# Patient Record
Sex: Male | Born: 1965 | Race: White | Hispanic: No | Marital: Married | Smoking: Current every day smoker
Health system: Southern US, Community
[De-identification: ages and names within clinical notes are randomized; demographics above are authoritative.]

---

## 2006-04-24 ENCOUNTER — Emergency Department: Payer: Self-pay | Admitting: Emergency Medicine

## 2007-04-13 ENCOUNTER — Emergency Department: Payer: Self-pay | Admitting: Internal Medicine

## 2007-05-14 ENCOUNTER — Emergency Department: Payer: Self-pay | Admitting: Emergency Medicine

## 2010-02-16 ENCOUNTER — Emergency Department: Payer: Self-pay | Admitting: Emergency Medicine

## 2010-12-29 ENCOUNTER — Emergency Department: Payer: Self-pay | Admitting: Emergency Medicine

## 2011-03-11 ENCOUNTER — Emergency Department: Payer: Self-pay | Admitting: Emergency Medicine

## 2011-03-14 ENCOUNTER — Emergency Department: Payer: Self-pay | Admitting: Emergency Medicine

## 2013-01-21 ENCOUNTER — Emergency Department: Payer: Self-pay | Admitting: Emergency Medicine

## 2013-09-03 ENCOUNTER — Emergency Department: Payer: Self-pay | Admitting: Emergency Medicine

## 2013-09-03 LAB — BASIC METABOLIC PANEL
Anion Gap: 7 (ref 7–16)
BUN: 14 mg/dL (ref 7–18)
CALCIUM: 9.1 mg/dL (ref 8.5–10.1)
CHLORIDE: 106 mmol/L (ref 98–107)
Co2: 24 mmol/L (ref 21–32)
Creatinine: 1.06 mg/dL (ref 0.60–1.30)
EGFR (African American): >60
EGFR (Non-African Amer.): >60
Glucose: 84 mg/dL (ref 65–99)
Osmolality: 273 (ref 275–301)
Potassium: 3.6 mmol/L (ref 3.5–5.1)
Sodium: 137 mmol/L (ref 136–145)

## 2013-09-03 LAB — CBC
HCT: 39.2 % — ABNORMAL LOW (ref 40.0–52.0)
HGB: 13.6 g/dL (ref 13.0–18.0)
MCH: 33.1 pg (ref 26.0–34.0)
MCHC: 34.7 g/dL (ref 32.0–36.0)
MCV: 96 fL (ref 80–100)
Platelet: 311 10*3/uL (ref 150–440)
RBC: 4.1 10*6/uL — AB (ref 4.40–5.90)
RDW: 12.3 % (ref 11.5–14.5)
WBC: 10.6 10*3/uL (ref 3.8–10.6)

## 2013-12-16 ENCOUNTER — Emergency Department: Payer: Self-pay | Admitting: Emergency Medicine

## 2014-01-06 ENCOUNTER — Emergency Department: Payer: Self-pay | Admitting: Emergency Medicine

## 2015-04-18 ENCOUNTER — Emergency Department
Admission: EM | Admit: 2015-04-18 | Discharge: 2015-04-18 | Disposition: A | Discharge status: Home / Self Care | Payer: Self-pay | Attending: Emergency Medicine | Admitting: Emergency Medicine

## 2015-04-18 ENCOUNTER — Encounter: Payer: Self-pay | Admitting: Emergency Medicine

## 2015-04-18 DIAGNOSIS — M5442 Lumbago with sciatica, left side: Secondary | ICD-10-CM

## 2015-04-18 DIAGNOSIS — Y998 Other external cause status: Secondary | ICD-10-CM | POA: Insufficient documentation

## 2015-04-18 DIAGNOSIS — Y9389 Activity, other specified: Secondary | ICD-10-CM | POA: Insufficient documentation

## 2015-04-18 DIAGNOSIS — X58XXXA Exposure to other specified factors, initial encounter: Secondary | ICD-10-CM | POA: Insufficient documentation

## 2015-04-18 DIAGNOSIS — Z720 Tobacco use: Secondary | ICD-10-CM | POA: Insufficient documentation

## 2015-04-18 DIAGNOSIS — Y9289 Other specified places as the place of occurrence of the external cause: Secondary | ICD-10-CM | POA: Insufficient documentation

## 2015-04-18 DIAGNOSIS — S39012A Strain of muscle, fascia and tendon of lower back, initial encounter: Secondary | ICD-10-CM

## 2015-04-18 MED ORDER — TRAMADOL HCL 50 MG PO TABS: 50.0000 mg | ORAL_TABLET | Freq: Four times a day (QID) | ORAL | Status: AC | PRN

## 2015-04-18 MED ORDER — PREDNISONE 20 MG PO TABS: 40.0000 mg | ORAL_TABLET | Freq: Every day | ORAL | Status: DC

## 2015-04-18 NOTE — ED Provider Notes (Signed)
[facility] Emergency Department Provider Note  Time seen: 1:48 PM  I have reviewed the triage vital signs and the nursing notes.   HISTORY  Chief Complaint Back Pain    HPI Brandon Mejia is a 49 y.o. male with no past medical history presents the emergency department with lower back pain. According to the patient he was lifting heavy boxes one week ago, since this past Friday he does significant pain to lower back. He states he has been largely embedded over the weekend due to the back pain. He has not improved so he came to the emergency department for evaluation. Denies any incontinence, denies any focal weakness or numbness. Describes his back pain is moderate, worse with ambulation or twisting or bending.    History reviewed. No pertinent past medical history.  There are no active problems to display for this patient.   History reviewed. No pertinent past surgical history.  No current outpatient prescriptions on file.  Allergies Review of patient's allergies indicates no known allergies.  History reviewed. No pertinent family history.  Social History Social History  Substance Use Topics  . Smoking status: Current Some Day Smoker  . Smokeless tobacco: None  . Alcohol Use: None    Review of Systems Constitutional: Negative for fever. Cardiovascular: Negative for chest pain. Respiratory: Negative for shortness of breath. Gastrointestinal: Negative for abdominal pain Musculoskeletal: Positive for lower back pain. Neurological: Negative for headaches, focal weakness or numbness. 10-point ROS otherwise negative.  ____________________________________________   PHYSICAL EXAM:  VITAL SIGNS: ED Triage Vitals  Enc Vitals Group     BP 04/18/15 1320 153/73 mmHg     Pulse Rate 04/18/15 1320 106     Resp 04/18/15 1320 18     Temp 04/18/15 1320 98.4 F (36.9 C)     Temp Source 04/18/15 1320 Oral     SpO2 04/18/15 1320 99 %     Weight  04/18/15 1320 145 lb (65.772 kg)     Height 04/18/15 1320  (1.753 m)     Head Cir --      Peak Flow --      Pain Score 04/18/15 1344 8     Pain Loc --      Pain Edu? --      Excl. in GC? --     Constitutional: Alert and oriented. Well appearing and in no distress. Eyes: Normal exam ENT   Head: Normocephalic and atraumatic. Cardiovascular: Normal rate, regular rhythm.  Respiratory: Normal respiratory effort without tachypnea nor retractions. Breath sounds are clear and equal bilaterally. No wheezes/rales/rhonchi. Gastrointestinal: Soft and nontender. No distention.   Musculoskeletal: Moderate lumbar tenderness palpation, more so over the left paraspinal muscles. Neurologic:  Normal speech and language. No gross focal neurologic deficits  Skin:  Skin is warm, dry and intact.  Psychiatric: Mood and affect are normal. Speech and behavior are normal.  ____________________________________________     INITIAL IMPRESSION / ASSESSMENT AND PLAN / ED COURSE  Pertinent labs & imaging results that were available during my care of the patient were reviewed by me and considered in my medical decision making (see chart for details).  Patient with lower back pain for the past 3-4 days. He states occasional radiation down the left leg. Denies incontinence, denies focal weakness or numbness. Neurologically appears intact on exam. Patient states a history of back problems for many years with occasional flares of pain typical to today's presentation. Given the radicular nature to his pain we  will place the patient on a burst dose of steroids, Ultram as needed for discomfort, and follow-up with an orthopedist. Patient is agreeable to this plan.  ____________________________________________   FINAL CLINICAL IMPRESSION(S) / ED DIAGNOSES  Lumbosacral strain   Minna Antis, MD 04/18/15 1351

## 2015-04-18 NOTE — ED Notes (Signed)
Reports lifting boxes a week ago, back pain since

## 2015-04-18 NOTE — Discharge Instructions (Signed)
Back Pain, Adult °Back pain is very common. The pain often gets better over time. The cause of back pain is usually not dangerous. Most people can learn to manage their back pain on their own.  °HOME CARE  °· Stay active. Start with short walks on flat ground if you can. Try to walk farther each day. °· Do not sit, drive, or stand in one place for more than 30 minutes. Do not stay in bed. °· Do not avoid exercise or work. Activity can help your back heal faster. °· Be careful when you bend or lift an object. Bend at your knees, keep the object close to you, and do not twist. °· Sleep on a firm mattress. Lie on your side, and bend your knees. If you lie on your back, put a pillow under your knees. °· Only take medicines as told by your doctor. °· Put ice on the injured area. °¨ Put ice in a plastic bag. °¨ Place a towel between your skin and the bag. °¨ Leave the ice on for 15-20 minutes, 03-04 times a day for the first 2 to 3 days. After that, you can switch between ice and heat packs. °· Ask your doctor about back exercises or massage. °· Avoid feeling anxious or stressed. Find good ways to deal with stress, such as exercise. °GET HELP RIGHT AWAY IF:  °· Your pain does not go away with rest or medicine. °· Your pain does not go away in 1 week. °· You have new problems. °· You do not feel well. °· The pain spreads into your legs. °· You cannot control when you poop (bowel movement) or pee (urinate). °· Your arms or legs feel weak or lose feeling (numbness). °· You feel sick to your stomach (nauseous) or throw up (vomit). °· You have belly (abdominal) pain. °· You feel like you may pass out (faint). °MAKE SURE YOU:  °· Understand these instructions. °· Will watch your condition. °· Will get help right away if you are not doing well or get worse. °Document Released: 01/16/2008 Document Revised: 10/22/2011 Document Reviewed: 12/01/2013 °Exit[employer]® Patient Information ©2015 [employer]. This information is not intended  to replace advice given to you by your health care provider. Make sure you discuss any questions you have with your health care provider. ° °

## 2021-10-05 ENCOUNTER — Observation Stay (HOSPITAL_COMMUNITY)
Admission: EM | Admit: 2021-10-05 | Discharge: 2021-10-06 | Disposition: A | Discharge status: Home / Self Care | Payer: BC Managed Care – PPO | Attending: Family Medicine | Admitting: Family Medicine

## 2021-10-05 ENCOUNTER — Emergency Department (HOSPITAL_COMMUNITY): Payer: BC Managed Care – PPO

## 2021-10-05 ENCOUNTER — Other Ambulatory Visit: Payer: Self-pay

## 2021-10-05 ENCOUNTER — Encounter (HOSPITAL_COMMUNITY): Payer: Self-pay | Admitting: Emergency Medicine

## 2021-10-05 DIAGNOSIS — R918 Other nonspecific abnormal finding of lung field: Secondary | ICD-10-CM | POA: Insufficient documentation

## 2021-10-05 DIAGNOSIS — Z20822 Contact with and (suspected) exposure to covid-19: Secondary | ICD-10-CM | POA: Diagnosis not present

## 2021-10-05 DIAGNOSIS — R000 Tachycardia, unspecified: Secondary | ICD-10-CM | POA: Diagnosis not present

## 2021-10-05 DIAGNOSIS — R112 Nausea with vomiting, unspecified: Secondary | ICD-10-CM

## 2021-10-05 DIAGNOSIS — R4189 Other symptoms and signs involving cognitive functions and awareness: Secondary | ICD-10-CM | POA: Diagnosis not present

## 2021-10-05 DIAGNOSIS — Z720 Tobacco use: Secondary | ICD-10-CM

## 2021-10-05 DIAGNOSIS — R778 Other specified abnormalities of plasma proteins: Secondary | ICD-10-CM | POA: Insufficient documentation

## 2021-10-05 DIAGNOSIS — F17210 Nicotine dependence, cigarettes, uncomplicated: Secondary | ICD-10-CM | POA: Diagnosis not present

## 2021-10-05 DIAGNOSIS — T40721A Poisoning by synthetic cannabinoids, accidental (unintentional), initial encounter: Secondary | ICD-10-CM | POA: Insufficient documentation

## 2021-10-05 DIAGNOSIS — F1910 Other psychoactive substance abuse, uncomplicated: Secondary | ICD-10-CM

## 2021-10-05 DIAGNOSIS — D72829 Elevated white blood cell count, unspecified: Secondary | ICD-10-CM | POA: Diagnosis not present

## 2021-10-05 LAB — COMPREHENSIVE METABOLIC PANEL
ALT: 66 U/L — ABNORMAL HIGH (ref 0–44)
AST: 56 U/L — ABNORMAL HIGH (ref 15–41)
Albumin: 3.9 g/dL (ref 3.5–5.0)
Alkaline Phosphatase: 79 U/L (ref 38–126)
Anion gap: 8 (ref 5–15)
BUN: 17 mg/dL (ref 6–20)
CO2: 27 mmol/L (ref 22–32)
Calcium: 8.7 mg/dL — ABNORMAL LOW (ref 8.9–10.3)
Chloride: 105 mmol/L (ref 98–111)
Creatinine, Ser: 1.13 mg/dL (ref 0.61–1.24)
GFR, Estimated: >60 mL/min (ref >60)
Glucose, Bld: 178 mg/dL — ABNORMAL HIGH (ref 70–99)
Potassium: 4.7 mmol/L (ref 3.5–5.1)
Sodium: 140 mmol/L (ref 135–145)
Total Bilirubin: 0.5 mg/dL (ref 0.3–1.2)
Total Protein: 6.9 g/dL (ref 6.5–8.1)

## 2021-10-05 LAB — TROPONIN I (HIGH SENSITIVITY)
Troponin I (High Sensitivity): 18 ng/L — ABNORMAL HIGH (ref <18)
Troponin I (High Sensitivity): 30 ng/L — ABNORMAL HIGH (ref <18)
Troponin I (High Sensitivity): 36 ng/L — ABNORMAL HIGH (ref <18)

## 2021-10-05 LAB — CBC WITH DIFFERENTIAL/PLATELET
Abs Immature Granulocytes: 0.27 10*3/uL — ABNORMAL HIGH (ref 0.00–0.07)
Basophils Absolute: 0 10*3/uL (ref 0.0–0.1)
Basophils Relative: 0 %
Eosinophils Absolute: 0 10*3/uL (ref 0.0–0.5)
Eosinophils Relative: 0 %
HCT: 40.5 % (ref 39.0–52.0)
Hemoglobin: 13.8 g/dL (ref 13.0–17.0)
Immature Granulocytes: 1 %
Lymphocytes Relative: 4 %
Lymphs Abs: 0.8 10*3/uL (ref 0.7–4.0)
MCH: 33.6 pg (ref 26.0–34.0)
MCHC: 34.1 g/dL (ref 30.0–36.0)
MCV: 98.5 fL (ref 80.0–100.0)
Monocytes Absolute: 1.3 10*3/uL — ABNORMAL HIGH (ref 0.1–1.0)
Monocytes Relative: 6 %
Neutro Abs: 19.1 10*3/uL — ABNORMAL HIGH (ref 1.7–7.7)
Neutrophils Relative %: 89 %
Platelets: 285 10*3/uL (ref 150–400)
RBC: 4.11 MIL/uL — ABNORMAL LOW (ref 4.22–5.81)
RDW: 12.7 % (ref 11.5–15.5)
WBC: 21.5 10*3/uL — ABNORMAL HIGH (ref 4.0–10.5)
nRBC: 0 % (ref 0.0–0.2)

## 2021-10-05 LAB — RAPID URINE DRUG SCREEN, HOSP PERFORMED
Amphetamines: NOT DETECTED
Barbiturates: NOT DETECTED
Benzodiazepines: NOT DETECTED
Cocaine: POSITIVE — AB
Opiates: NOT DETECTED
Tetrahydrocannabinol: POSITIVE — AB

## 2021-10-05 LAB — RESP PANEL BY RT-PCR (FLU A&B, COVID) ARPGX2
Influenza A by PCR: NEGATIVE
Influenza B by PCR: NEGATIVE
SARS Coronavirus 2 by RT PCR: NEGATIVE

## 2021-10-05 LAB — HIV ANTIBODY (ROUTINE TESTING W REFLEX): HIV Screen 4th Generation wRfx: NONREACTIVE

## 2021-10-05 LAB — D-DIMER, QUANTITATIVE: D-Dimer, Quant: 0.97 ug/mL-FEU — ABNORMAL HIGH (ref 0.00–0.50)

## 2021-10-05 LAB — ACETAMINOPHEN LEVEL: Acetaminophen (Tylenol), Serum: <10 ug/mL — ABNORMAL LOW (ref 10–30)

## 2021-10-05 LAB — SALICYLATE LEVEL: Salicylate Lvl: <7 mg/dL — ABNORMAL LOW (ref 7.0–30.0)

## 2021-10-05 MED ORDER — ENOXAPARIN SODIUM 40 MG/0.4ML IJ SOSY
40.0000 mg | PREFILLED_SYRINGE | INTRAMUSCULAR | Status: DC
  Administered 2021-10-05: 40 mg via SUBCUTANEOUS
  Filled 2021-10-05: qty 0.4

## 2021-10-05 MED ORDER — ACETAMINOPHEN 650 MG RE SUPP: 650.0000 mg | Freq: Four times a day (QID) | RECTAL | Status: DC | PRN

## 2021-10-05 MED ORDER — SODIUM CHLORIDE 0.9 % IV SOLN: INTRAVENOUS | Status: DC

## 2021-10-05 MED ORDER — LACTATED RINGERS IV BOLUS
1000.0000 mL | Freq: Once | INTRAVENOUS | Status: AC
  Administered 2021-10-05: 1000 mL via INTRAVENOUS

## 2021-10-05 MED ORDER — ACETAMINOPHEN 325 MG PO TABS
650.0000 mg | ORAL_TABLET | Freq: Four times a day (QID) | ORAL | Status: DC | PRN
  Administered 2021-10-05 – 2021-10-06 (×3): 650 mg via ORAL
  Filled 2021-10-05 (×3): qty 2

## 2021-10-05 NOTE — ED Provider Notes (Incomplete)
Patient is a 56 year old male presenting today as a possible post CPR and possible drug overdose.  EMS reported that patient was found down and when fire got there he was agonal without palpable pulses.  He received several minutes of CPR and then they felt like they got a pulse back and noted that his pupils were pinpoint and gave Narcan.  Patient did have a King airway present when EMS arrived and they proceeded to put him in the truck as he had normal sinus rhythm at the time of their arrival.  Patient did not receive epinephrine and did not receive any shocks.  In route patient became more more lucid and self removed the [facility] airway.  Currently he has no complaints but reported he did not feel well this morning he had some nausea and vomiting and that is the last thing he remembers.  He adamantly denies using any drugs.  Very little medical information is available on this patient but no prior history of drug use.  Need to rule out cardiac pathology possible dysrhythmia.  EKG labs and imaging are pending.

## 2021-10-05 NOTE — H&P (Signed)
TR[facility] H&P   Patient Demographics:    Brandon Mejia, is a 56 y.o. male  MRN: [id number]   DOB - 04/16/1966  Admit Date - 10/05/2021  Outpatient Primary MD for the patient is Pcp, No  Referring MD/NP/PA: Dr Greta Doom  Patient coming from: Home  Chief Complaint  Patient presents with   Drug Overdose      HPI:    Brandon Mejia  is a 56 y.o. male, without significant past medical history besides tobacco abuse, THC use, who presents to ED by EMS after an episode of unresponsiveness, patient cannot recall events, wife at bedside assisted with the history, as well it was obtained from ED physician, patient presented to ED as possible post CPR versus post drug overdose, fire department reportedly found him down, he had agonal breathing and was pulseless, he was given CPR for 3 minutes and were able to get PulsePak with continued pinpoint pupils and agonal respiration, Narcan was given without much help, King airway was placed, patient then removed the [facility] airway, continue to have improvement of mental status through the time with EMS, by the time he came to the ED he was awake, alert and appropriate, he does not recall the events exactly, report he was doing in the chest, when he got home he was playing with PlayStation game, generalized weakness, fatigue, he does report feeling ill at work yesterday with nausea and vomiting with some chills as well, wife reports she found him around 1230 slumped on the chair where she called EMS where he was unresponsive, there was no tongue biting, urine or stool incontinence, patient urine drug screen came back positive for THC and cocaine, patient denies any history of cocaine use, but reports he does THC which she did overnight. - in ED awake, alert and oriented, white blood cell count was elevated at 21,000, CT head with no acute finding, EKG nonacute, troponins  trending up, but remains on the lower side 18> 30,.  [facility] consulted to admit.    Review of systems:    In addition to the HPI above,   A full 10 point Review of Systems was done, except as stated above, all other Review of Systems were negative.   With Past History of the following :    History reviewed. No pertinent past medical history.    History reviewed. No pertinent surgical history.    Social History:     Social History   Tobacco Use   Smoking status: Every Day    Types: Cigarettes   Smokeless tobacco: Not on file  Substance Use Topics   Alcohol use: Not Currently      Family History :   Patient denies any family history of sudden death.   Home Medications:   Prior to Admission medications   Medication Sig Start Date End Date Taking? Authorizing Provider  Aspirin-Salicylamide-Caffeine [facility] HEADACHE POWDER  PO) Take 1 tablet by mouth daily as needed.   Yes [provider]     Allergies:    No Known Allergies   Physical Exam:   Vitals  Blood pressure 97/77, pulse 79, temperature 98.6 F (37 C), temperature source Oral, resp. rate 20, SpO2 93 %.   1. General lying in bed in NAD,    2. Normal affect and insight, Not Suicidal or Homicidal, Awake Alert, Oriented X 3.  3. No F.N deficits, ALL C.Nerves Intact, Strength 5/5 all 4 extremities, Sensation intact all 4 extremities, Plantars down going.  4. Ears and Eyes appear Normal, Conjunctivae clear, PERRLA. Moist Oral Mucosa.  5. Supple Neck, No JVD, No cervical lymphadenopathy appriciated, No Carotid Bruits.  6. Symmetrical Chest wall movement, Good air movement bilaterally, CTAB.  7. RRR, No Gallops, Rubs or Murmurs, No Parasternal Heave.  8. Positive Bowel Sounds, Abdomen Soft, No tenderness, No organomegaly appriciated,No rebound -guarding or rigidity.  9.  No Cyanosis, Normal Skin Turgor, No Skin Rash or Bruise.  10. Good muscle tone,  joints appear normal , no effusions,  Normal ROM.  11. No Palpable Lymph Nodes in Neck or Axillae     Data Review:    CBC Recent Labs  Lab 10/05/21 1401  WBC 21.5*  HGB 13.8  HCT 40.5  PLT 285  MCV 98.5  MCH 33.6  MCHC 34.1  RDW 12.7  LYMPHSABS 0.8  MONOABS 1.3*  EOSABS 0.0  BASOSABS 0.0   ------------------------------------------------------------------------------------------------------------------  Chemistries  Recent Labs  Lab 10/05/21 1401  NA 140  K 4.7  CL 105  CO2 27  GLUCOSE 178*  BUN 17  CREATININE 1.13  CALCIUM 8.7*  AST 56*  ALT 66*  ALKPHOS 79  BILITOT 0.5   ------------------------------------------------------------------------------------------------------------------ CrCl cannot be calculated (Unknown ideal weight.). ------------------------------------------------------------------------------------------------------------------ No results for input(s): TSH, T4TOTAL, T3FREE, THYROIDAB in the last 72 hours.  Invalid input(s): FREET3  Coagulation profile No results for input(s): INR, PROTIME in the last 168 hours. ------------------------------------------------------------------------------------------------------------------- No results for input(s): DDIMER in the last 72 hours. -------------------------------------------------------------------------------------------------------------------  Cardiac Enzymes No results for input(s): CKMB, TROPONINI, MYOGLOBIN in the last 168 hours.  Invalid input(s): CK ------------------------------------------------------------------------------------------------------------------ No results found for: BNP   ---------------------------------------------------------------------------------------------------------------  Urinalysis No results found for: COLORURINE, APPEARANCEUR, LABSPEC, PHURINE, GLUCOSEU, HGBUR, BILIRUBINUR, KETONESUR, PROTEINUR, UROBILINOGEN, NITRITE,  LEUKOCYTESUR  ----------------------------------------------------------------------------------------------------------------   Imaging Results:    CT Head Wo Contrast  Result Date: 10/05/2021 CLINICAL DATA:  Fall, head trauma EXAM: CT HEAD WITHOUT CONTRAST TECHNIQUE: Contiguous axial images were obtained from the base of the skull through the vertex without intravenous contrast. RADIATION DOSE REDUCTION: This exam was performed according to the departmental dose-optimization program which includes automated exposure control, adjustment of the mA and/or kV according to patient size and/or use of iterative reconstruction technique. COMPARISON:  CT head 09/03/2013 FINDINGS: Brain: No evidence of acute infarction, hemorrhage, hydrocephalus, extra-axial collection or mass lesion/mass effect. Vascular: Negative for hyperdense vessel Skull: Negative Sinuses/Orbits: Mucosal edema throughout the paranasal sinuses. Air-fluid level left maxillary sinus. Other: None IMPRESSION: Normal CT head Sinus mucosal disease with air-fluid level on the left. Electronically Signed   By: Franchot Gallo M.D.   On: 10/05/2021 14:35   DG Chest Portable 1 View  Result Date: 10/05/2021 CLINICAL DATA:  Unresponsive. Probable drug overdose. Status post CPR. EXAM: PORTABLE CHEST 1 VIEW COMPARISON:  None. FINDINGS: The heart size and mediastinal contours are within normal limits. Both lungs are clear. No evidence of pneumomediastinum or  pneumothorax. No evidence of pleural effusion. The visualized skeletal structures are unremarkable. IMPRESSION: No acute findings.  No active disease. Electronically Signed   By: Marlaine Hind M.D.   On: 10/05/2021 13:56    My personal review of EKG: Rhythm sinus tachycardia, Rate  106/min, QTc 448    Assessment & Plan:    Principal Problem:   Unresponsiveness Active Problems:   Elevated troponin   Substance abuse (HCC)   Leukocytosis   Nausea and vomiting   Tobacco abuse    Episode of  unresponsiveness -Unclear if patient lost pulse, or this was an episode of loss of consciousness, but he did receive CPR for 3 minutes . -This may be multifactorial, most likely in the setting of substance abuse including cocaine/THC, as well due to recent viral illness where he has been having generalized body ache, nausea and vomiting. -CT head with no acute findings -Continue with IV fluids. -We will monitor on telemetry. -Continue to cycle troponins. -2D echo. -We will check D-dimers and if elevated will check CTA chest -Check magnesium level  Elevated troponins  - most likely related to chest compression, will continue to cycle, EKG nonacute.  Follow on 2D echo.  Nausea/vomiting -Is most likely due to underlying viral illness, keep on IV fluids and as needed nausea medications -Follow on COVID-19 test.  Substance abuse -Counseled, he denies any intentional cocaine use, he reports likely his THC was laced with cocaine.  Tobacco abuse -He was counseled.  Leukocytosis -This is stress related, he is afebrile, nontoxic-appearing   DVT Prophylaxis Lovenox   AM Labs Ordered, also please review Full Orders  Family Communication: Admission, patients condition and plan of care including tests being ordered have been discussed with the patient and wife at bedside who indicate understanding and agree with the plan and Code Status.  Code Status Full  Likely DC to  home  Condition GUARDED    Consults called: none    Admission status: observation    Time spent in minutes : 55 minutes   Phillips Climes M.D on 10/05/2021 at 5:08 PM   [facility] - Office  [phone]

## 2021-10-05 NOTE — ED Notes (Signed)
Placed pt on 2l of O2 due to SPO2 dropping down to 81

## 2021-10-05 NOTE — ED Triage Notes (Signed)
Pt arrives via EMS as post CPR possible drug overdose. Pt was found unresponsive at a firestation. The crew gave 3 rounds of CPR with return of pulses. King airway was placed. Pt was given narcan around 1200 after they noticed pinpoint pupils. Hx of substance abuse. Pt began to wake up and pull king airway out. EMS placed nonrebreather on pt. Pt arrives alert/oriented, no complaints. BP 137/78, HR 100, 96% on room air now. CBG 300

## 2021-10-05 NOTE — ED Provider Notes (Addendum)
[facility] EMERGENCY DEPARTMENT  Provider Note  CSN: [id number] Arrival date & time: 10/05/21 1319  History Chief Complaint  Patient presents with   Drug Overdose    Brandon Mejia is a 56 y.o. male who presented today as a possible post CPR versus post drug overdose.  Fire department reportedly found the patient down.  He had agonal respirations and was pulseless.  He was given CPR and they are able to get a pulse back but he continued pinpoint pupils and agonal respirations.  Narcan was given.  King airway was placed.  Patient then removed the [facility] airway or attempted to.  He continued to have improvement in his mental status throughout the time with EMS.  The airway was removed.  On arrival he was awake and alert.  He did not remember what happened today.  He adamantly denies any use of drugs or alcohol today.  He worked overnight last night, as he normally does.  He reported getting home and playing video games and then did not remember thing else for the morning.   Home Medications Prior to Admission medications   Medication Sig Start Date End Date Taking? Authorizing Provider  predniSONE (DELTASONE) 20 MG tablet Take 2 tablets (40 mg total) by mouth daily. 04/18/15   Harvest Dark, MD     Allergies    Patient has no known allergies.   Review of Systems   Review of Systems  Constitutional:  Negative for chills and fever.  HENT:  Negative for ear pain and sore throat.   Eyes:  Negative for pain and visual disturbance.  Respiratory:  Negative for cough and shortness of breath.   Cardiovascular:  Negative for chest pain and palpitations.  Gastrointestinal:  Negative for abdominal pain and vomiting.  Genitourinary:  Negative for dysuria and hematuria.  Musculoskeletal:  Negative for arthralgias and back pain.  Skin:  Negative for color change and rash.  Neurological:  Negative for seizures and syncope.  All other systems reviewed and are negative. Please  see HPI for pertinent positives and negatives  Physical Exam BP 91/65    Pulse 72    Temp 98.6 F (37 C) (Oral)    Resp (!) 7    SpO2 94%   Physical Exam Vitals and nursing note reviewed.  Constitutional:      General: He is not in acute distress.    Appearance: He is well-developed.  HENT:     Head: Normocephalic and atraumatic.  Eyes:     Conjunctiva/sclera: Conjunctivae normal.  Cardiovascular:     Rate and Rhythm: Regular rhythm. Tachycardia present.     Heart sounds: No murmur heard. Pulmonary:     Effort: Pulmonary effort is normal. No respiratory distress.     Breath sounds: Normal breath sounds.  Abdominal:     Palpations: Abdomen is soft.     Tenderness: There is no abdominal tenderness.  Musculoskeletal:        General: No swelling.     Cervical back: Neck supple.     Right lower leg: No edema.     Left lower leg: No edema.  Skin:    General: Skin is warm and dry.     Capillary Refill: Capillary refill takes less than 2 seconds.     Findings: No rash.  Neurological:     General: No focal deficit present.     Mental Status: He is alert and oriented to person, place, and time. Mental status is at  baseline.     Cranial Nerves: No cranial nerve deficit.     Sensory: No sensory deficit.     Motor: No weakness.  Psychiatric:        Mood and Affect: Mood normal.    ED Results / Procedures / Treatments   EKG EKG Interpretation  Date/Time:  Thursday October 05 2021 13:21:56 EST Ventricular Rate:  106 PR Interval:  135 QRS Duration: 83 QT Interval:  337 QTC Calculation: 448 R Axis:   81 Text Interpretation: Sinus tachycardia No previous tracing Confirmed by Blanchie Dessert [phone]) on 10/05/2021 1:35:33 PM  Procedures Procedures  Medications Ordered in the ED Medications  lactated ringers bolus 1,000 mL (0 mLs Intravenous Stopped 10/05/21 1518)     ED Course       MDM   This patient presents to the ED after requiring CPR and Narcan, this  involves an extensive number of treatment options, and is a complaint that carries with it a high risk of complications and morbidity.  The differential diagnosis includes drug overdose, seizure, dysrhythmia.   Additional history obtained: Additional history obtained from family and EMS  Records reviewed previous admission documents, Care Everywhere/External Records, and Primary Care Documents  Lab Tests: I Ordered, and personally interpreted labs.  The pertinent results include: Leukocytosis.  Salicylates and acetaminophen were normal.  Initial troponin 18. Remainder of labs are pending.  Imaging Studies ordered: I ordered imaging studies including CT scan head and X-ray last   I independently visualized and interpreted imaging which showed no acute abnormalities on either the CT or the chest x-ray. I agree with the radiologist interpretation  EKG (personally reviewed and interpreted): No acute abnormalities.  No dysrhythmia.  No STEMI.  No priors to compare to.   Medical Decision Making: Presented after possible drug overdose.  He required CPR and Narcan in the field.  However, he only denies any drug use or alcohol use today.  No previous opioid prescriptions based on PDMP review.  He was AOx4 on arrival and at baseline.  He did have leukocytosis on labs but no significant findings on exam. Initial troponin 18. Etiology of his presentation today remained unclear at the time of signout.  Plan for admission to the hospital for ongoing evaluation and treatment.  Patient is agreeable to this plan.  Complexity of problems addressed: Patients presentation is most consistent with  acute presentation with potential threat to life or bodily function  Patient seen in conjunction with my attending, Dr. Maryan Rued.    Final Clinical Impression(s) / ED Diagnoses Final diagnoses:  Unresponsive    Rx / DC Orders ED Discharge Orders     None           Jacelyn Pi, MD 10/05/21 [place], MD 10/06/21 [phone]

## 2021-10-05 NOTE — Progress Notes (Signed)
Patient arrived to 4E from ED. Vitals taken and stable. Tele placed and CCMD notified. CHG performed. Assessment completed and patient oriented to unit. Call bell within reach. Menu provided for patient to order dinner.  Brandon Mejia

## 2021-10-06 ENCOUNTER — Observation Stay (HOSPITAL_BASED_OUTPATIENT_CLINIC_OR_DEPARTMENT_OTHER): Payer: BC Managed Care – PPO

## 2021-10-06 ENCOUNTER — Observation Stay (HOSPITAL_COMMUNITY): Payer: BC Managed Care – PPO

## 2021-10-06 DIAGNOSIS — I469 Cardiac arrest, cause unspecified: Secondary | ICD-10-CM

## 2021-10-06 DIAGNOSIS — R4189 Other symptoms and signs involving cognitive functions and awareness: Secondary | ICD-10-CM

## 2021-10-06 LAB — CBC
HCT: 39.7 % (ref 39.0–52.0)
Hemoglobin: 13.2 g/dL (ref 13.0–17.0)
MCH: 33 pg (ref 26.0–34.0)
MCHC: 33.2 g/dL (ref 30.0–36.0)
MCV: 99.3 fL (ref 80.0–100.0)
Platelets: 241 10*3/uL (ref 150–400)
RBC: 4 MIL/uL — ABNORMAL LOW (ref 4.22–5.81)
RDW: 12.9 % (ref 11.5–15.5)
WBC: 14 10*3/uL — ABNORMAL HIGH (ref 4.0–10.5)
nRBC: 0 % (ref 0.0–0.2)

## 2021-10-06 LAB — COMPREHENSIVE METABOLIC PANEL
ALT: 45 U/L — ABNORMAL HIGH (ref 0–44)
AST: 50 U/L — ABNORMAL HIGH (ref 15–41)
Albumin: 3.5 g/dL (ref 3.5–5.0)
Alkaline Phosphatase: 66 U/L (ref 38–126)
Anion gap: 9 (ref 5–15)
BUN: 18 mg/dL (ref 6–20)
CO2: 23 mmol/L (ref 22–32)
Calcium: 8.5 mg/dL — ABNORMAL LOW (ref 8.9–10.3)
Chloride: 105 mmol/L (ref 98–111)
Creatinine, Ser: 0.76 mg/dL (ref 0.61–1.24)
GFR, Estimated: >60 mL/min (ref >60)
Glucose, Bld: 80 mg/dL (ref 70–99)
Potassium: 5 mmol/L (ref 3.5–5.1)
Sodium: 137 mmol/L (ref 135–145)
Total Bilirubin: 1.3 mg/dL — ABNORMAL HIGH (ref 0.3–1.2)
Total Protein: 6.1 g/dL — ABNORMAL LOW (ref 6.5–8.1)

## 2021-10-06 LAB — ECHOCARDIOGRAM COMPLETE
Area-P 1/2: 2.87 cm2
Height: 69 in
S' Lateral: 3.4 cm
Weight: 2640 oz

## 2021-10-06 LAB — MAGNESIUM: Magnesium: 2 mg/dL (ref 1.7–2.4)

## 2021-10-06 MED ORDER — IOHEXOL 350 MG/ML SOLN
80.0000 mL | Freq: Once | INTRAVENOUS | Status: AC | PRN
  Administered 2021-10-06: 80 mL via INTRAVENOUS

## 2021-10-06 NOTE — Discharge Summary (Signed)
Physician Discharge Summary  Brandon Mejia JJH:[id number] DOB: 1966-07-16 DOA: 10/05/2021  PCP: Pcp, No  Admit date: 10/05/2021 Discharge date: 10/06/2021  Time spent: 40 minutes  Recommendations for Outpatient Follow-up:  Follow-up with primary care physician in 1 to 2 weeks Stop using illegal drugs it will kill you   Discharge Diagnoses:  Principal Problem:   Unresponsiveness Active Problems:   Tobacco abuse   Substance abuse (HCC)   Leukocytosis   Elevated troponin   Nausea and vomiting   Discharge Condition: Stable   Filed Weights   10/05/21 1744  Weight: 74.8 kg    History of present illness:   Brandon Mejia  is a 56 y.o. male, without significant past medical history besides tobacco abuse, THC use, who presents to ED by EMS after an episode of unresponsiveness, patient cannot recall events, wife at bedside assisted with the history, as well it was obtained from ED physician, patient presented to ED as possible post CPR versus post drug overdose, fire department reportedly found him down, he had agonal breathing and was pulseless, he was given CPR for 3 minutes and were able to get PulsePak with continued pinpoint pupils and agonal respiration, Narcan was given without much help, King airway was placed, patient then removed the [facility] airway, continue to have improvement of mental status through the time with EMS, by the time he came to the ED he was awake, alert and appropriate, he does not recall the events exactly, report he was doing in the chest, when he got home he was playing with PlayStation game, generalized weakness, fatigue, he does report feeling ill at work yesterday with nausea and vomiting with some chills as well, wife reports she found him around 1230 slumped on the chair where she called EMS where he was unresponsive, there was no tongue biting, urine or stool incontinence, patient urine drug screen came back positive for THC and cocaine, patient denies any history of  cocaine use, but reports he does THC which she did overnight. - in ED awake, alert and oriented, white blood cell count was elevated at 21,000, CT head with no acute finding, EKG nonacute, troponins trending up, but remains on the lower side 18> 30,.  [facility] consulted to admit.  Hospital Course:  Episode of unresponsiveness -Unclear if patient lost pulse, or this was an episode of loss of consciousness, but he did receive CPR for 3 minutes . -This may be multifactorial, most likely in the setting of substance abuse including cocaine/THC, as well due to recent viral illness where he has been having generalized body ache, nausea and vomiting. -CT head with no acute findings normal -Continue with IV fluids. -We will monitor on telemetry. -Continue to cycle troponins.  All normal -2D echo normal -CTA negative for PE -Patient instructed to stop doing illicit substances -Doubt actually lost pulse and was not a true code as patient had quick response to less than 3 minutes of CPR   Elevated troponins  - most likely related to chest compression, will continue to cycle, EKG nonacute.     Nausea/vomiting -Is most likely due to underlying viral illness, keep on IV fluids and as needed nausea medications   Substance abuse -Counseled, he denies any intentional cocaine use, he reports likely his THC was laced with cocaine.   Tobacco abuse -He was counseled.   Leukocytosis -This is stress related, he is afebrile, nontoxic-appearing   Patient discharged in improved and baseline condition.  Again instructed against the dangers of  using illicit drugs.  He is to follow-up with his primary care physician approximately 1 to 2 weeks.    Discharge Exam: Vitals:   10/06/21 1211 10/06/21 1553  BP: 100/62   Pulse: 62   Resp: 16   Temp: 98.1 F (36.7 C)   SpO2: 96% 94%    General: Alert and oriented no apparent distress Cardiovascular: Regular rate and rhythm without murmurs rubs or  gallops Respiratory: Clear to auscultation bilateral no wheezes rhonchi rales  Discharge Instructions   Discharge Instructions     Diet - low sodium heart healthy   Complete by: As directed    Discharge instructions   Complete by: As directed    Stop doing illegal drugs, it will kill you  Follow-up with primary care physician in 1 week   Increase activity slowly   Complete by: As directed       Allergies as of 10/06/2021   No Known Allergies      Medication List     STOP taking these medications    BC HEADACHE POWDER PO       No Known Allergies    The results of significant diagnostics from this hospitalization (including imaging, microbiology, ancillary and laboratory) are listed below for reference.    Significant Diagnostic Studies: CT Head Wo Contrast  Result Date: 10/05/2021 CLINICAL DATA:  Fall, head trauma EXAM: CT HEAD WITHOUT CONTRAST TECHNIQUE: Contiguous axial images were obtained from the base of the skull through the vertex without intravenous contrast. RADIATION DOSE REDUCTION: This exam was performed according to the departmental dose-optimization program which includes automated exposure control, adjustment of the mA and/or kV according to patient size and/or use of iterative reconstruction technique. COMPARISON:  CT head 09/03/2013 FINDINGS: Brain: No evidence of acute infarction, hemorrhage, hydrocephalus, extra-axial collection or mass lesion/mass effect. Vascular: Negative for hyperdense vessel Skull: Negative Sinuses/Orbits: Mucosal edema throughout the paranasal sinuses. Air-fluid level left maxillary sinus. Other: None IMPRESSION: Normal CT head Sinus mucosal disease with air-fluid level on the left. Electronically Signed   By: Marlan Palau M.D.   On: 10/05/2021 14:35   CT Angio Chest Pulmonary Embolism (PE) W or WO Contrast  Result Date: 10/06/2021 CLINICAL DATA:  Simple syncope. Normal neuro exam. Chest pain. Unresponsive yesterday and had 4  rounds of CPR. Possible drug overdose. EXAM: CT ANGIOGRAPHY CHEST WITH CONTRAST TECHNIQUE: Multidetector CT imaging of the chest was performed using the standard protocol during bolus administration of intravenous contrast. Multiplanar CT image reconstructions and MIPs were obtained to evaluate the vascular anatomy. RADIATION DOSE REDUCTION: This exam was performed according to the departmental dose-optimization program which includes automated exposure control, adjustment of the mA and/or kV according to patient size and/or use of iterative reconstruction technique. CONTRAST:  59mL OMNIPAQUE IOHEXOL 350 MG/ML SOLN COMPARISON:  AP chest 10/05/2021 FINDINGS: Cardiovascular: The main pulmonary artery is opacified up to 328 Hounsfield units. No filling defect is seen to indicate a pulmonary embolism. Heart size is at the upper limits of normal. No pericardial effusion. No thoracic aortic aneurysm. No aortic dissection. Mediastinum/Nodes: No axillary mediastinal or hilar pathologically enlarged lymph nodes by CT criteria. The esophagus follows a normal course of normal caliber. Lungs/Pleura: The central airways are patent. Motion artifact mildly limits evaluation of the lung parenchyma. Mild bilateral peripheral inferior subpleural interlobular septal thickening ground-glass opacities, and curvilinear opacities, likely subsegmental atelectasis versus scarring. No pleural effusion or pneumothorax. No focal airspace consolidation to indicate pneumonia. Upper Abdomen: No acute abnormality. Musculoskeletal: Mild  multilevel degenerative disc changes of the thoracic spine Review of the MIP images confirms the above findings. IMPRESSION:: IMPRESSION: 1. No acute pulmonary embolism. 2. Mild bilateral basilar likely subsegmental atelectasis versus scarring. Electronically Signed   By: Neita Garnet M.D.   On: 10/06/2021 08:18   DG Chest Portable 1 View  Result Date: 10/05/2021 CLINICAL DATA:  Unresponsive. Probable drug  overdose. Status post CPR. EXAM: PORTABLE CHEST 1 VIEW COMPARISON:  None. FINDINGS: The heart size and mediastinal contours are within normal limits. Both lungs are clear. No evidence of pneumomediastinum or pneumothorax. No evidence of pleural effusion. The visualized skeletal structures are unremarkable. IMPRESSION: No acute findings.  No active disease. Electronically Signed   By: Danae Orleans M.D.   On: 10/05/2021 13:56   ECHOCARDIOGRAM COMPLETE  Result Date: 10/06/2021    ECHOCARDIOGRAM REPORT   Patient Name:   KRISHAWN VANDERWEELE Date of Exam: 10/06/2021 Medical Rec #:  [id number]    Height:       69.0 in Accession #:    [id number]   Weight:       165.0 lb Date of Birth:  1966/06/28    BSA:          1.904 m Patient Age:    55 years     BP:           104/77 mmHg Patient Gender: M            HR:           83 bpm. Exam Location:  Inpatient Procedure: 2D Echo Indications:    cardiac arrest  History:        Patient has no prior history of Echocardiogram examinations.                 Risk Factors:substance abuse.  Sonographer:    Delcie Roch RDCS Referring Phys: [id number] [place] IMPRESSIONS  1. Left ventricular ejection fraction, by estimation, is 60 to 65%. The left ventricle has normal function. The left ventricle has no regional wall motion abnormalities. Left ventricular diastolic parameters are consistent with Grade I diastolic dysfunction (impaired relaxation).  2. Right ventricular systolic function is normal. The right ventricular size is normal. There is normal pulmonary artery systolic pressure.  3. The mitral valve is normal in structure. Mild mitral valve regurgitation. No evidence of mitral stenosis.  4. The aortic valve is normal in structure. Aortic valve regurgitation is not visualized. No aortic stenosis is present.  5. The inferior vena cava is normal in size with greater than 50% respiratory variability, suggesting right atrial pressure of 3 mmHg. FINDINGS  Left Ventricle: Left ventricular  ejection fraction, by estimation, is 60 to 65%. The left ventricle has normal function. The left ventricle has no regional wall motion abnormalities. The left ventricular internal cavity size was normal in size. There is  no left ventricular hypertrophy. Left ventricular diastolic parameters are consistent with Grade I diastolic dysfunction (impaired relaxation). Right Ventricle: The right ventricular size is normal. No increase in right ventricular wall thickness. Right ventricular systolic function is normal. There is normal pulmonary artery systolic pressure. The tricuspid regurgitant velocity is 2.86 m/s, and  with an assumed right atrial pressure of 3 mmHg, the estimated right ventricular systolic pressure is 35.7 mmHg. Left Atrium: Left atrial size was normal in size. Right Atrium: Right atrial size was normal in size. Pericardium: There is no evidence of pericardial effusion. Mitral Valve: The mitral valve is normal in structure. Mild mitral valve  regurgitation. No evidence of mitral valve stenosis. Tricuspid Valve: The tricuspid valve is normal in structure. Tricuspid valve regurgitation is trivial. No evidence of tricuspid stenosis. Aortic Valve: The aortic valve is normal in structure. Aortic valve regurgitation is not visualized. No aortic stenosis is present. Pulmonic Valve: The pulmonic valve was normal in structure. Pulmonic valve regurgitation is trivial. No evidence of pulmonic stenosis. Aorta: The aortic root is normal in size and structure. Venous: The inferior vena cava is normal in size with greater than 50% respiratory variability, suggesting right atrial pressure of 3 mmHg. IAS/Shunts: No atrial level shunt detected by color flow Doppler.  LEFT VENTRICLE PLAX 2D LVIDd:         5.00 cm   Diastology LVIDs:         3.40 cm   LV e' medial:    12.70 cm/s LV PW:         0.90 cm   LV E/e' medial:  7.4 LV IVS:        0.80 cm   LV e' lateral:   18.10 cm/s LVOT diam:     2.20 cm   LV E/e' lateral: 5.2 LV  SV:         74 LV SV Index:   39 LVOT Area:     3.80 cm  RIGHT VENTRICLE             IVC RV S prime:     16.90 cm/s  IVC diam: 2.10 cm TAPSE (M-mode): 2.7 cm LEFT ATRIUM             Index        RIGHT ATRIUM           Index LA diam:        3.50 cm 1.84 cm/m   RA Area:     16.70 cm LA Vol (A2C):   48.1 ml 25.27 ml/m  RA Volume:   47.40 ml  24.90 ml/m LA Vol (A4C):   48.2 ml 25.32 ml/m LA Biplane Vol: 50.4 ml 26.47 ml/m  AORTIC VALVE LVOT Vmax:   107.00 cm/s LVOT Vmean:  68.900 cm/s LVOT VTI:    0.195 m  AORTA Ao Root diam: 3.20 cm Ao Asc diam:  3.40 cm MITRAL VALVE               TRICUSPID VALVE MV Area (PHT): 2.87 cm    TR Peak grad:   32.7 mmHg MV Decel Time: 264 msec    TR Vmax:        286.00 cm/s MV E velocity: 94.60 cm/s MV A velocity: 94.10 cm/s  SHUNTS MV E/A ratio:  1.01        Systemic VTI:  0.20 m                            Systemic Diam: 2.20 cm Donato SchultzMark Skains MD Electronically signed by Donato SchultzMark Skains MD Signature Date/Time: 10/06/2021/4:05:59 PM    Final     Microbiology: Recent Results (from the past 240 hour(s))  Resp Panel by RT-PCR (Flu A&B, Covid) Nasopharyngeal Swab     Status: None   Collection Time: 10/05/21  3:54 PM   Specimen: Nasopharyngeal Swab; Nasopharyngeal(NP) swabs in vial transport medium  Result Value Ref Range Status   SARS Coronavirus 2 by RT PCR NEGATIVE NEGATIVE Final    Comment: (NOTE) SARS-CoV-2 target nucleic acids are NOT DETECTED.  The SARS-CoV-2 RNA is generally detectable in  upper respiratory specimens during the acute phase of infection. The lowest concentration of SARS-CoV-2 viral copies this assay can detect is 138 copies/mL. A negative result does not preclude SARS-Cov-2 infection and should not be used as the sole basis for treatment or other patient management decisions. A negative result may occur with  improper specimen collection/handling, submission of specimen other than nasopharyngeal swab, presence of viral mutation(s) within the areas  targeted by this assay, and inadequate number of viral copies(<138 copies/mL). A negative result must be combined with clinical observations, patient history, and epidemiological information. The expected result is Negative.  Fact Sheet for Patients:  BloggerCourse.comhttps://www.fda.gov/media/152166/download  Fact Sheet for Healthcare Providers:  SeriousBroker.ithttps://www.fda.gov/media/152162/download  This test is no t yet approved or cleared by the [place] FDA and  has been authorized for detection and/or diagnosis of SARS-CoV-2 by FDA under an Emergency Use Authorization (EUA). This EUA will remain  in effect (meaning this test can be used) for the duration of the COVID-19 declaration under Section 564(b)(1) of the Act, 21 U.S.C.section 360bbb-3(b)(1), unless the authorization is terminated  or revoked sooner.       Influenza A by PCR NEGATIVE NEGATIVE Final   Influenza B by PCR NEGATIVE NEGATIVE Final    Comment: (NOTE) The Xpert Xpress SARS-CoV-2/FLU/RSV plus assay is intended as an aid in the diagnosis of influenza from Nasopharyngeal swab specimens and should not be used as a sole basis for treatment. Nasal washings and aspirates are unacceptable for Xpert Xpress SARS-CoV-2/FLU/RSV testing.  Fact Sheet for Patients: BloggerCourse.comhttps://www.fda.gov/media/152166/download  Fact Sheet for Healthcare Providers: SeriousBroker.ithttps://www.fda.gov/media/152162/download  This test is not yet approved or cleared by the [place] FDA and has been authorized for detection and/or diagnosis of SARS-CoV-2 by FDA under an Emergency Use Authorization (EUA). This EUA will remain in effect (meaning this test can be used) for the duration of the COVID-19 declaration under Section 564(b)(1) of the Act, 21 U.S.C. section 360bbb-3(b)(1), unless the authorization is terminated or revoked.  Performed at [facility], [address] [place], [place] [address]      Labs: Basic Metabolic Panel: Recent Labs  Lab  10/05/21 1401 10/05/21 1720 10/06/21 0215  NA 140  --  137  K 4.7  --  5.0  CL 105  --  105  CO2 27  --  23  GLUCOSE 178*  --  80  BUN 17  --  18  CREATININE 1.13  --  0.76  CALCIUM 8.7*  --  8.5*  MG  --  2.0  --    Liver Function Tests: Recent Labs  Lab 10/05/21 1401 10/06/21 0215  AST 56* 50*  ALT 66* 45*  ALKPHOS 79 66  BILITOT 0.5 1.3*  PROT 6.9 6.1*  ALBUMIN 3.9 3.5   No results for input(s): LIPASE, AMYLASE in the last 168 hours. No results for input(s): AMMONIA in the last 168 hours. CBC: Recent Labs  Lab 10/05/21 1401 10/06/21 0215  WBC 21.5* 14.0*  NEUTROABS 19.1*  --   HGB 13.8 13.2  HCT 40.5 39.7  MCV 98.5 99.3  PLT 285 241   Cardiac Enzymes: No results for input(s): CKTOTAL, CKMB, CKMBINDEX, TROPONINI in the last 168 hours. BNP: BNP (last 3 results) No results for input(s): BNP in the last 8760 hours.  ProBNP (last 3 results) No results for input(s): PROBNP in the last 8760 hours.  CBG: No results for input(s): GLUCAP in the last 168 hours.     Signed:  Amarion Portell A  MD.  [facility] 10/06/2021, 4:29 PM

## 2021-10-06 NOTE — Progress Notes (Signed)
Nsg Discharge Note  Admit Date:  10/05/2021 Discharge date: 10/06/2021   Edy Belt to be D/C'd Home per MD order.  AVS completed. Patient/caregiver able to verbalize understanding.  Discharge Medication: Allergies as of 10/06/2021   No Known Allergies      Medication List     STOP taking these medications    BC HEADACHE POWDER PO        Discharge Assessment: Vitals:   10/06/21 1211 10/06/21 1553  BP: 100/62   Pulse: 62   Resp: 16   Temp: 98.1 F (36.7 C)   SpO2: 96% 94%   Skin clean, dry and intact without evidence of skin break down, no evidence of skin tears noted. IV catheter discontinued intact. Site without signs and symptoms of complications - no redness or edema noted at insertion site, patient denies c/o pain - only slight tenderness at site.  Dressing with slight pressure applied.  D/c Instructions-Education: Discharge instructions given to patient/family with verbalized understanding. D/c education completed with patient/family including follow up instructions, medication list, d/c activities limitations if indicated, with other d/c instructions as indicated by MD - patient able to verbalize understanding, all questions fully answered. Patient instructed to return to ED, call 911, or call MD for any changes in condition.  Patient escorted via WC, and D/C home via private auto.  Kizzie Bane, RN 10/06/2021 3:59 PM

## 2021-10-06 NOTE — Progress Notes (Signed)
°  Echocardiogram 2D Echocardiogram has been performed.  Delcie Roch 10/06/2021, 8:47 AM

## 2023-07-25 IMAGING — CT CT HEAD W/O CM
3 series · 16 of 47 positions shown, 19 images · non-contrast
Comparison: CT head 09/03/2013

CLINICAL DATA: Fall, head trauma



[Series 3: head 5.0 h30s · axial · 0.50mm/px · z∈[-171,-26]mm · 10 of 35 slices shown, 13 images]
[im 3/35  brain]
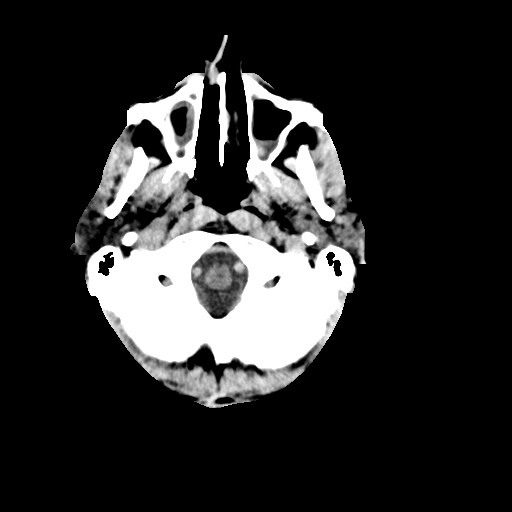
[im 3/35  bone]
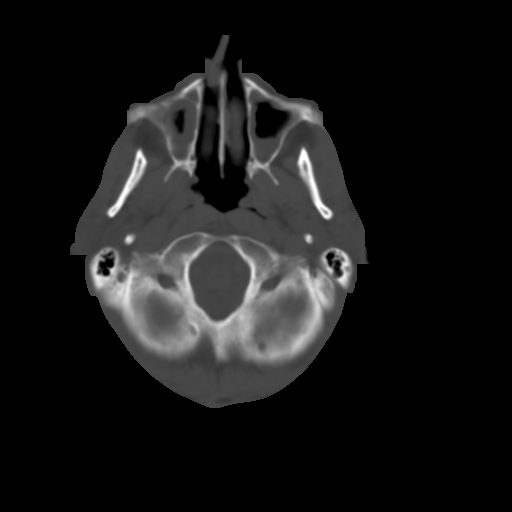
[im 6/35  brain]
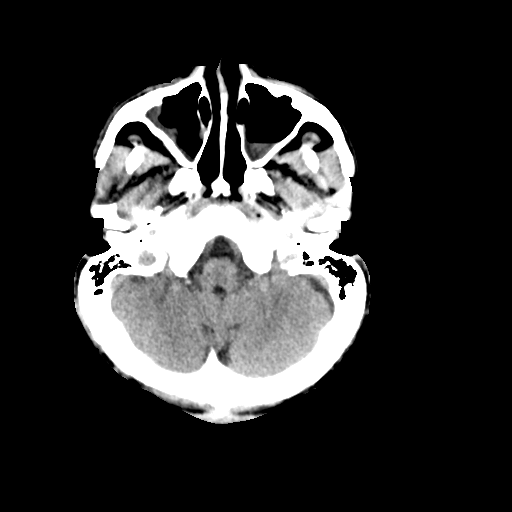
[im 10/35  brain]
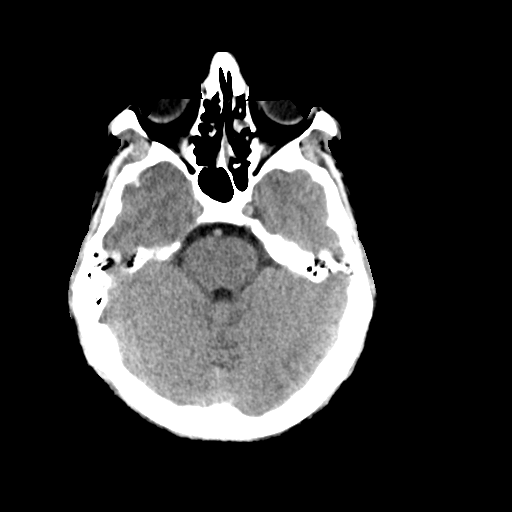
[im 12/35  brain]
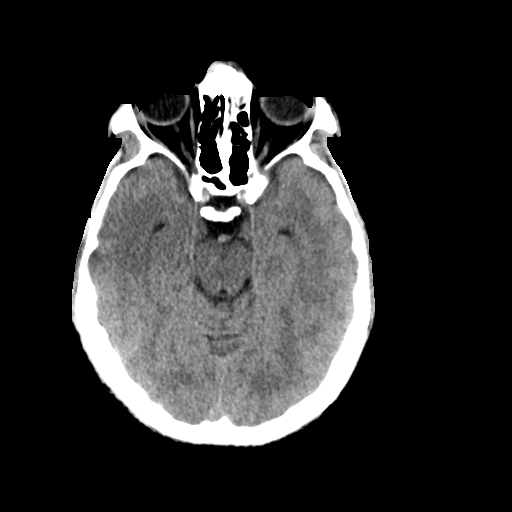
[im 16/35  brain]
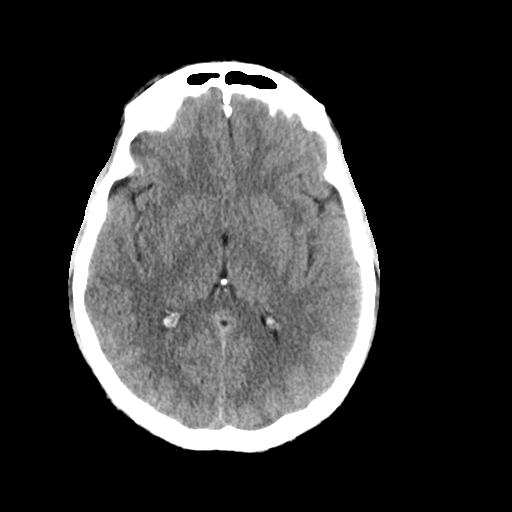
[im 16/35  bone]
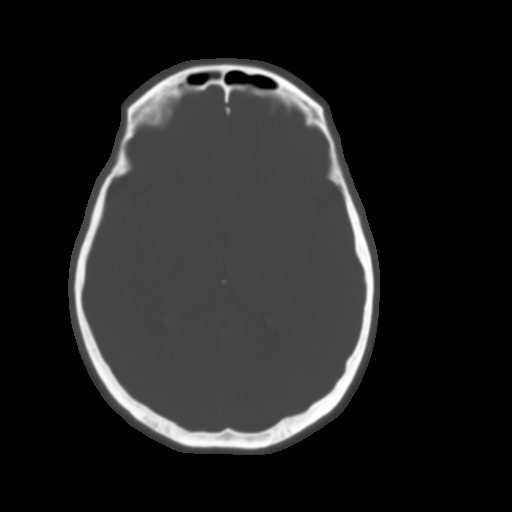
[im 19/35  brain]
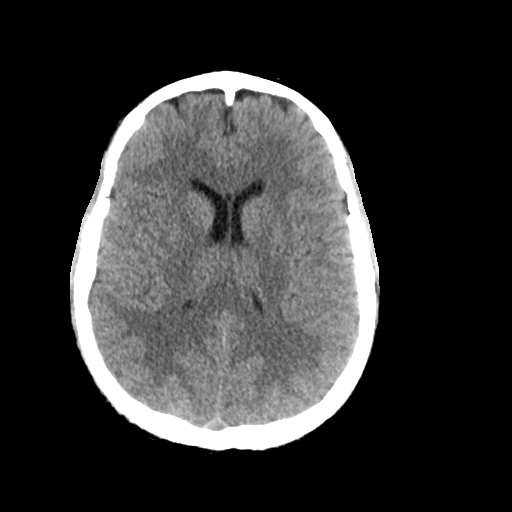
[im 23/35  brain]
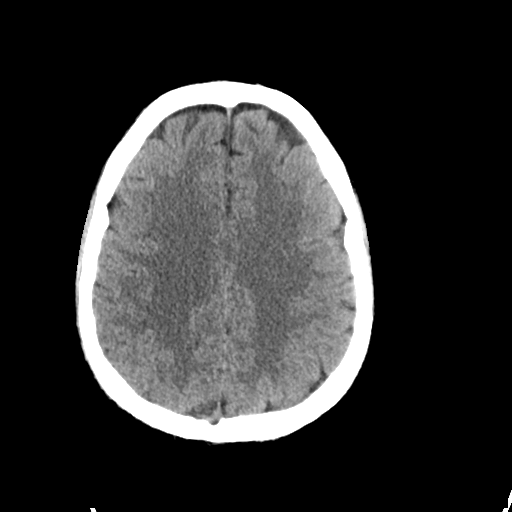
[im 26/35  brain]
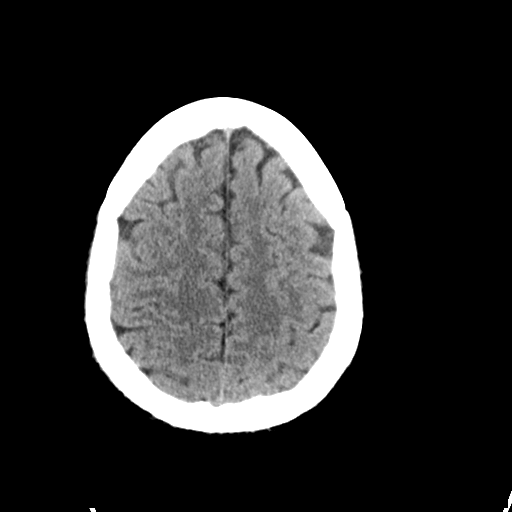
[im 29/35  brain]
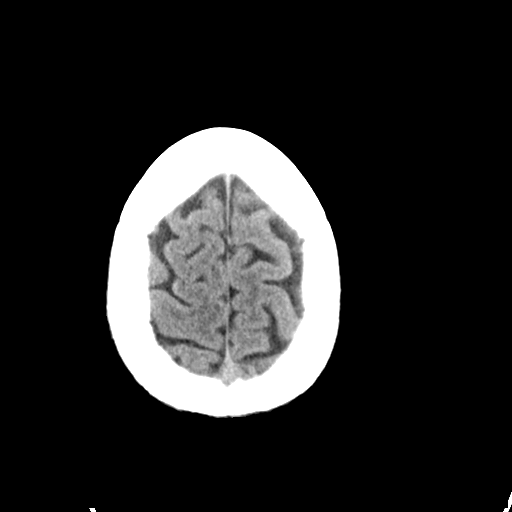
[im 29/35  bone]
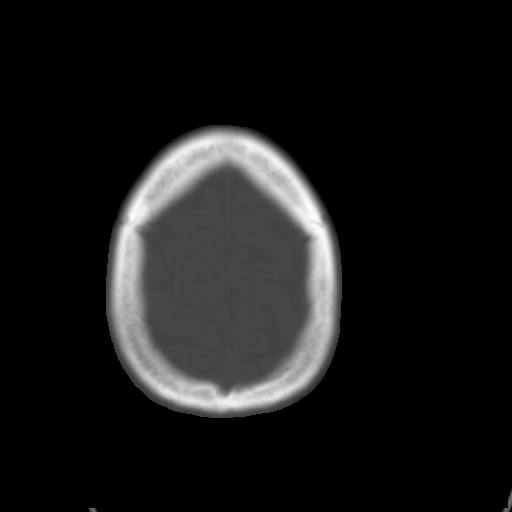
[im 32/35  brain]
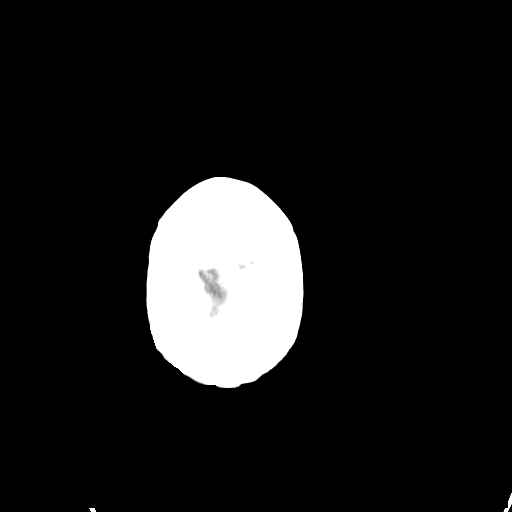

[Series 5: head 3.0 mpr cor · coronal · 0.36mm/px · 3 of 72 slices shown]
[im 24/72  brain]
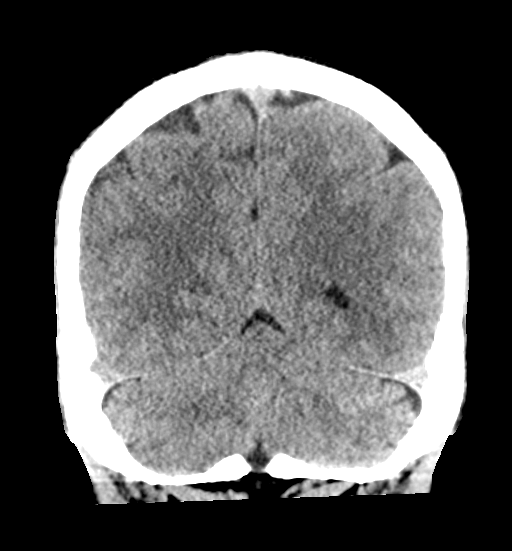
[im 32/72  brain]
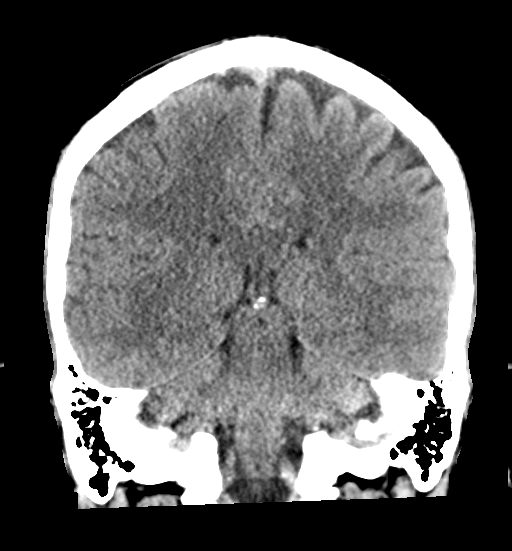
[im 40/72  brain]
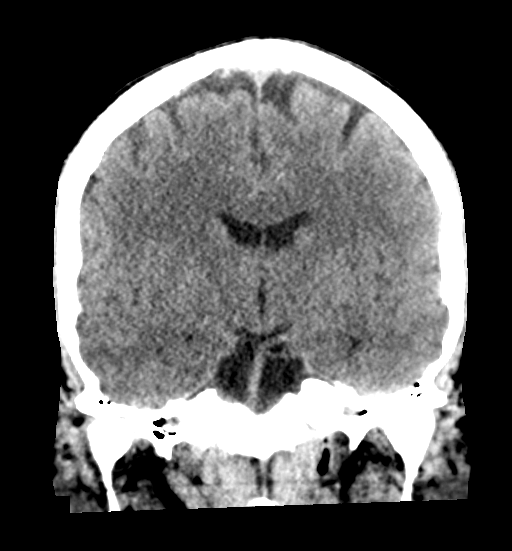

[Series 6: head 3.0 mpr sag · sagittal · 0.34mm/px · 3 of 67 slices shown]
[im 23/67  brain]
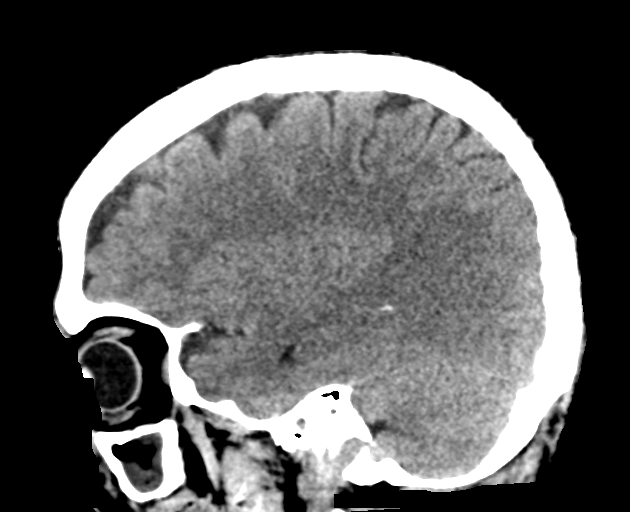
[im 34/67  brain]
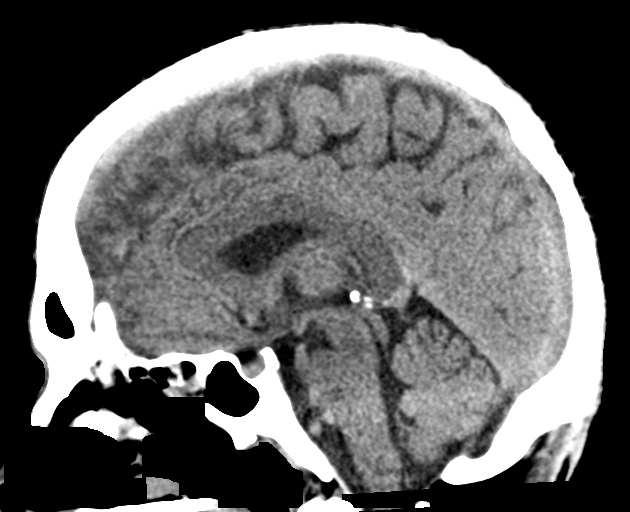
[im 45/67  brain]
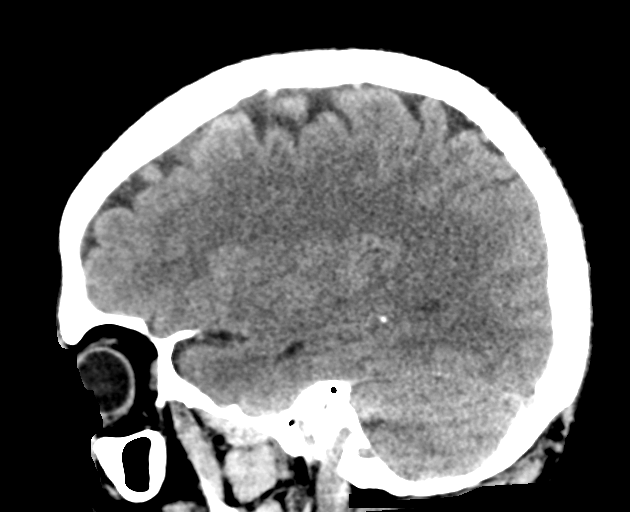

[16 of 47 positions shown; findings below may reference images not displayed]

FINDINGS: Brain: No evidence of acute infarction, hemorrhage, hydrocephalus,
extra-axial collection or mass lesion/mass effect.

Vascular: Negative for hyperdense vessel

Skull: Negative

Sinuses/Orbits: Mucosal edema throughout the paranasal sinuses.
Air-fluid level left maxillary sinus.

Other: None
IMPRESSION: Normal CT head

Sinus mucosal disease with air-fluid level on the left.

## 2023-07-26 IMAGING — CT CT ANGIO CHEST
2 of 7 series · 18 of 46 positions shown · IV contrast (APPLIED)
Comparison: AP chest 10/05/2021

CLINICAL DATA: Simple syncope. Normal neuro exam. Chest pain.
Unresponsive yesterday and had 4 rounds of CPR. Possible drug
overdose.

EXAM:
CT ANGIOGRAPHY CHEST WITH CONTRAST
TECHNIQUE: Multidetector CT imaging of the chest was performed using the
standard protocol during bolus administration of intravenous
contrast. Multiplanar CT image reconstructions and MIPs were
obtained to evaluate the vascular anatomy.

[Series 7: thins · axial · 0.72mm/px · z∈[+1255,+1524]mm · 15 of 433 slices shown]
[im 25/433  lung]
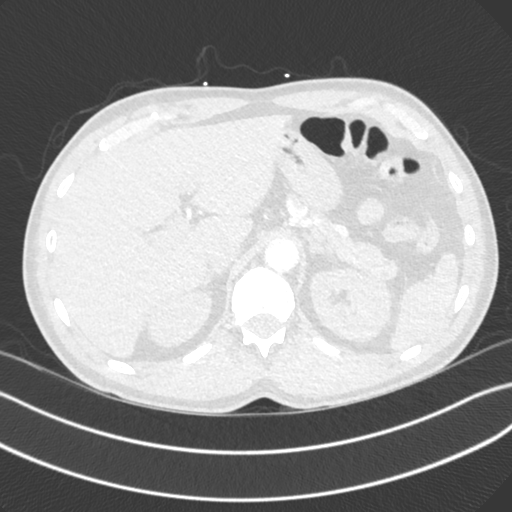
[im 49/433  soft-tissue]
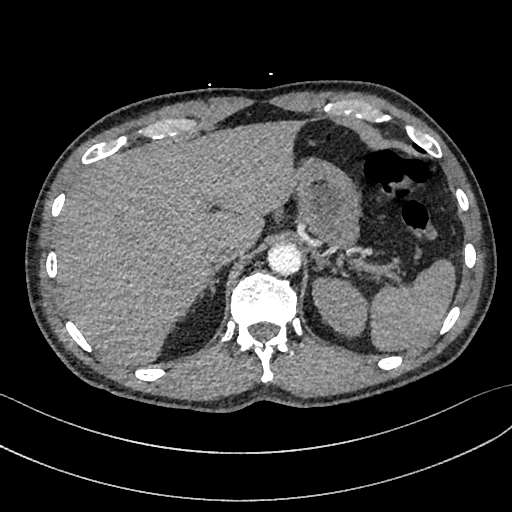
[im 73/433  lung]
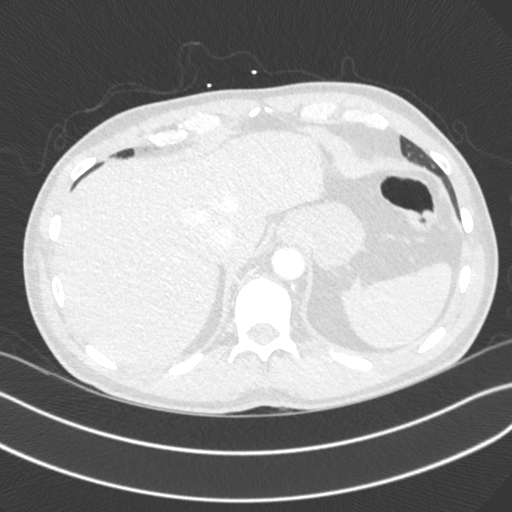
[im 97/433  soft-tissue]
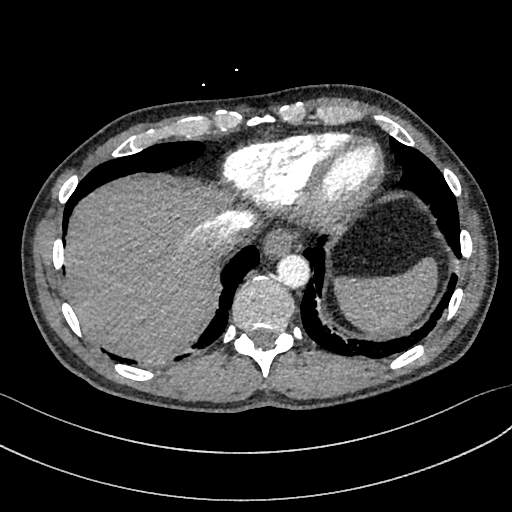
[im 145/433  lung]
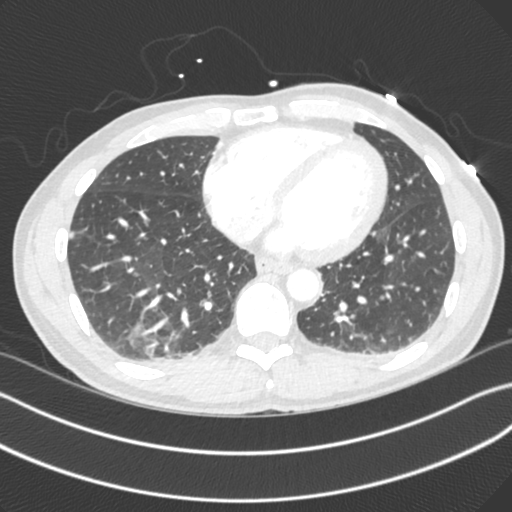
[im 169/433  soft-tissue]
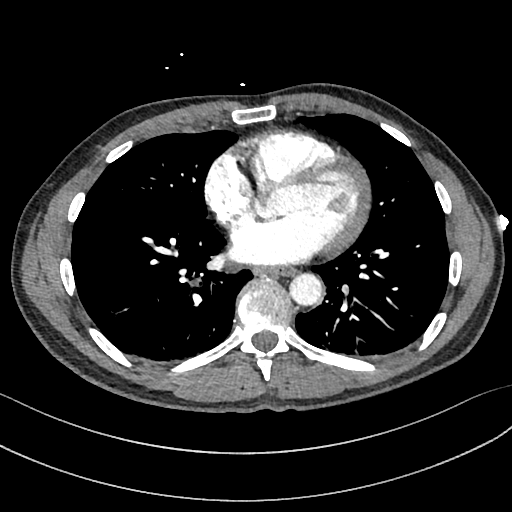
[im 193/433  lung]
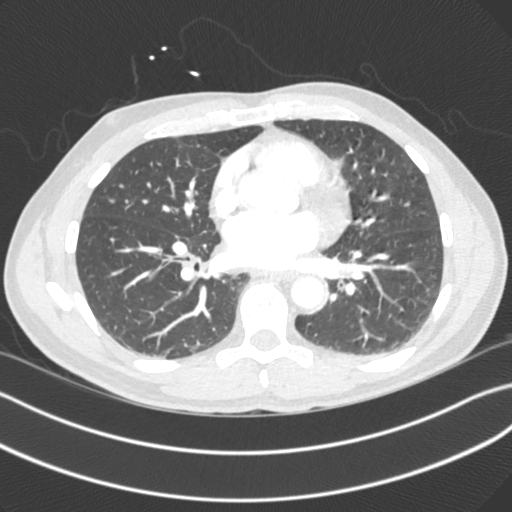
[im 217/433  soft-tissue]
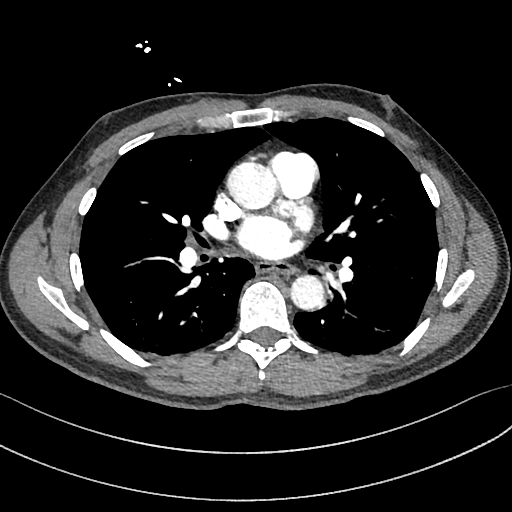
[im 241/433  lung]
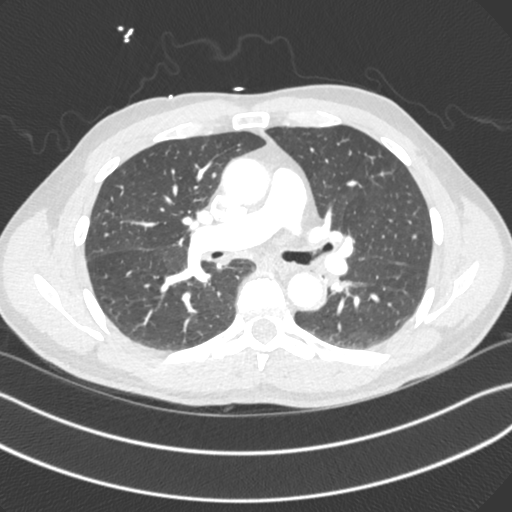
[im 265/433  soft-tissue]
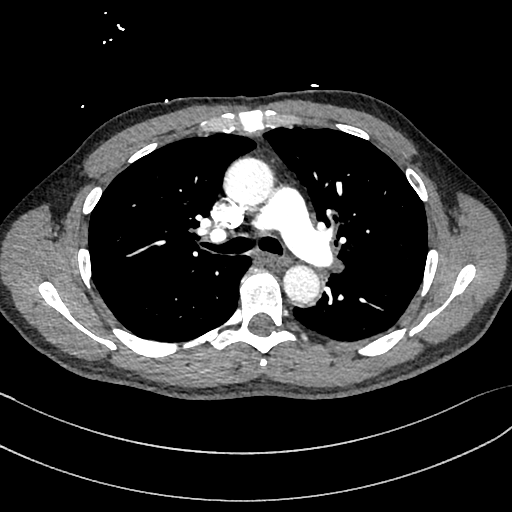
[im 289/433  lung]
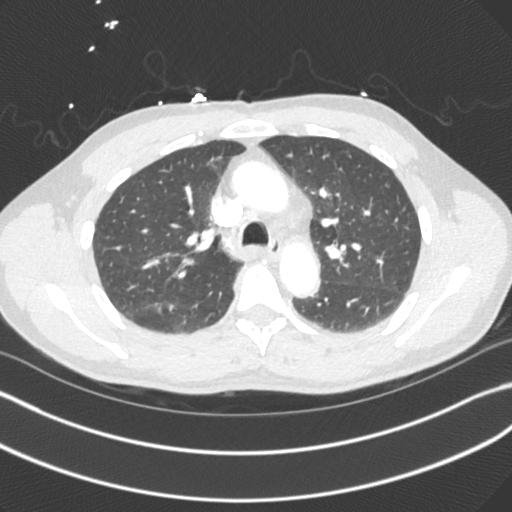
[im 337/433  soft-tissue]
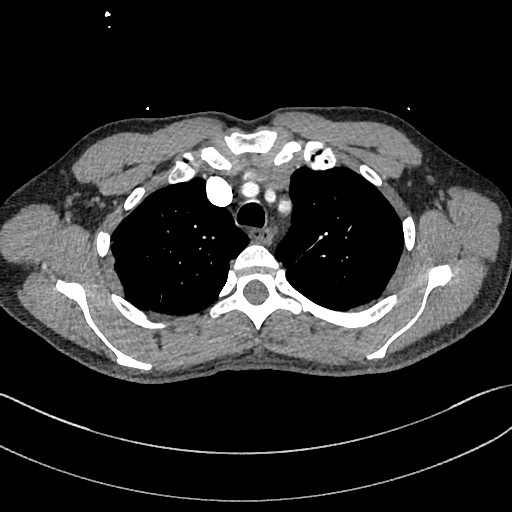
[im 361/433  lung]
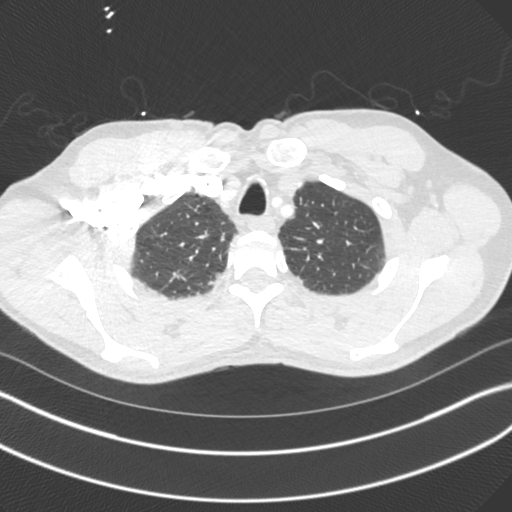
[im 385/433  soft-tissue]
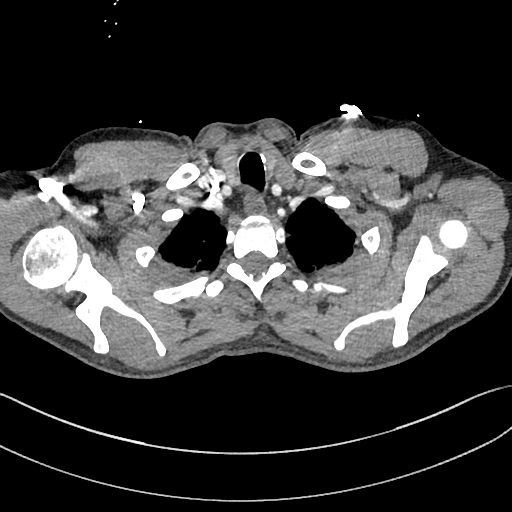
[im 409/433  lung]
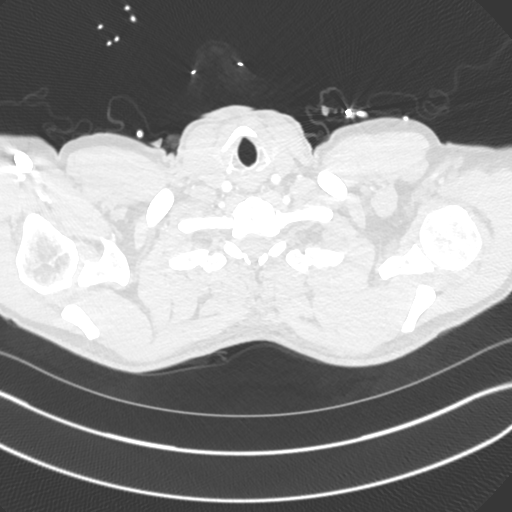

[Series 8: cor · coronal · 0.60mm/px · 3 of 131 slices shown]
[im 33/131  soft-tissue]
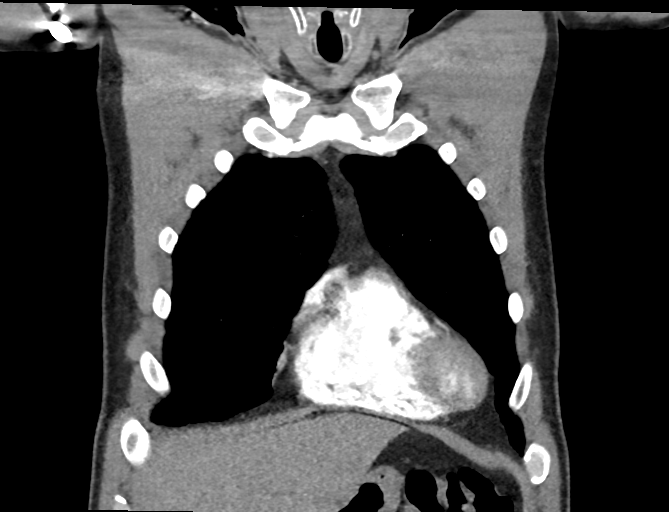
[im 66/131  soft-tissue]
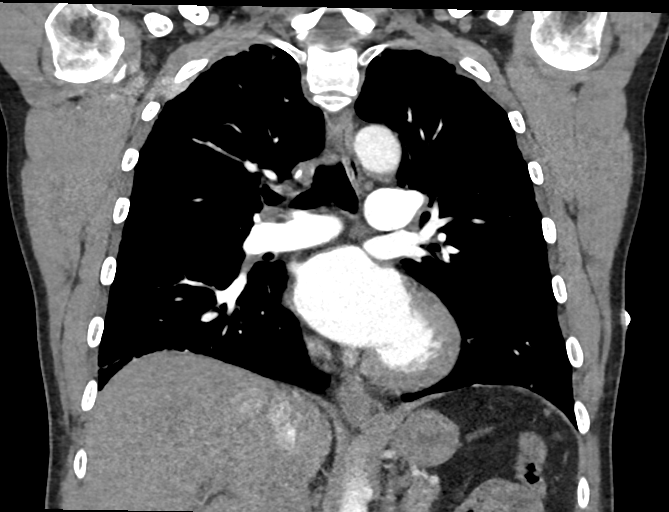
[im 98/131  soft-tissue]
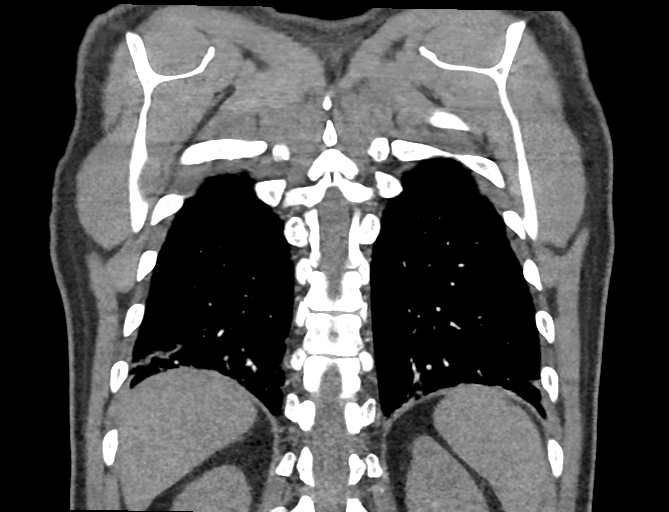

[18 of 46 positions shown; findings below may reference images not displayed]

RADIATION DOSE REDUCTION: This exam was performed according to the
departmental dose-optimization program which includes automated
exposure control, adjustment of the mA and/or kV according to
patient size and/or use of iterative reconstruction technique.

CONTRAST:  70mL OMNIPAQUE IOHEXOL 350 MG/ML SOLN
FINDINGS: Cardiovascular: The main pulmonary artery is opacified up to 328
Hounsfield units. No filling defect is seen to indicate a pulmonary
embolism.

Heart size is at the upper limits of normal. No pericardial
effusion. No thoracic aortic aneurysm. No aortic dissection.

Mediastinum/Nodes: No axillary mediastinal or hilar pathologically
enlarged lymph nodes by CT criteria. The esophagus follows a normal
course of normal caliber.

Lungs/Pleura: The central airways are patent. Motion artifact mildly
limits evaluation of the lung parenchyma. Mild bilateral peripheral
inferior subpleural interlobular septal thickening ground-glass
opacities, and curvilinear opacities, likely subsegmental
atelectasis versus scarring. No pleural effusion or pneumothorax. No
focal airspace consolidation to indicate pneumonia.

Upper Abdomen: No acute abnormality.

Musculoskeletal: Mild multilevel degenerative disc changes of the
thoracic spine

Review of the MIP images confirms the above findings.
IMPRESSION: :
IMPRESSION: 1. No acute pulmonary embolism.
2. Mild bilateral basilar likely subsegmental atelectasis versus
scarring.
